# Patient Record
Sex: Male | Born: 1935 | Race: White | Hispanic: No | Marital: Married | Smoking: Never smoker
Health system: Southern US, Community
[De-identification: ages and names within clinical notes are randomized; demographics above are authoritative.]

## PROBLEM LIST (undated history)

## (undated) DIAGNOSIS — E119 Type 2 diabetes mellitus without complications: Secondary | ICD-10-CM

## (undated) DIAGNOSIS — F319 Bipolar disorder, unspecified: Secondary | ICD-10-CM

---

## 1999-04-03 ENCOUNTER — Inpatient Hospital Stay (HOSPITAL_COMMUNITY): Admission: AD | Admit: 1999-04-03 | Discharge: 1999-04-09 | Payer: Self-pay | Admitting: Psychiatry

## 2011-02-11 ENCOUNTER — Encounter (INDEPENDENT_AMBULATORY_CARE_PROVIDER_SITE_OTHER): Payer: Self-pay | Admitting: Ophthalmology

## 2011-03-08 ENCOUNTER — Encounter (INDEPENDENT_AMBULATORY_CARE_PROVIDER_SITE_OTHER): Payer: Self-pay | Admitting: Ophthalmology

## 2011-03-27 ENCOUNTER — Ambulatory Visit (INDEPENDENT_AMBULATORY_CARE_PROVIDER_SITE_OTHER): Payer: BC Managed Care – PPO | Admitting: Ophthalmology

## 2011-03-27 DIAGNOSIS — E11319 Type 2 diabetes mellitus with unspecified diabetic retinopathy without macular edema: Secondary | ICD-10-CM

## 2011-03-27 DIAGNOSIS — E1139 Type 2 diabetes mellitus with other diabetic ophthalmic complication: Secondary | ICD-10-CM

## 2011-03-27 DIAGNOSIS — E1165 Type 2 diabetes mellitus with hyperglycemia: Secondary | ICD-10-CM

## 2011-03-27 DIAGNOSIS — H43819 Vitreous degeneration, unspecified eye: Secondary | ICD-10-CM

## 2011-12-27 ENCOUNTER — Ambulatory Visit (INDEPENDENT_AMBULATORY_CARE_PROVIDER_SITE_OTHER): Payer: Medicare Other | Admitting: Ophthalmology

## 2011-12-27 DIAGNOSIS — H43819 Vitreous degeneration, unspecified eye: Secondary | ICD-10-CM

## 2011-12-27 DIAGNOSIS — H35039 Hypertensive retinopathy, unspecified eye: Secondary | ICD-10-CM

## 2011-12-27 DIAGNOSIS — E11319 Type 2 diabetes mellitus with unspecified diabetic retinopathy without macular edema: Secondary | ICD-10-CM

## 2011-12-27 DIAGNOSIS — E1165 Type 2 diabetes mellitus with hyperglycemia: Secondary | ICD-10-CM

## 2011-12-27 DIAGNOSIS — I10 Essential (primary) hypertension: Secondary | ICD-10-CM

## 2012-06-26 ENCOUNTER — Ambulatory Visit (INDEPENDENT_AMBULATORY_CARE_PROVIDER_SITE_OTHER): Payer: Medicare Other | Admitting: Ophthalmology

## 2014-02-17 ENCOUNTER — Encounter (HOSPITAL_COMMUNITY): Payer: Self-pay | Admitting: Emergency Medicine

## 2014-02-17 ENCOUNTER — Emergency Department (HOSPITAL_COMMUNITY)
Admission: EM | Admit: 2014-02-17 | Discharge: 2014-02-17 | Disposition: A | Discharge status: Home / Self Care | Payer: Medicare HMO | Attending: Emergency Medicine | Admitting: Emergency Medicine

## 2014-02-17 ENCOUNTER — Emergency Department (HOSPITAL_COMMUNITY): Payer: Medicare HMO

## 2014-02-17 DIAGNOSIS — S199XXA Unspecified injury of neck, initial encounter: Secondary | ICD-10-CM | POA: Diagnosis present

## 2014-02-17 DIAGNOSIS — Y9389 Activity, other specified: Secondary | ICD-10-CM | POA: Diagnosis not present

## 2014-02-17 DIAGNOSIS — R197 Diarrhea, unspecified: Secondary | ICD-10-CM | POA: Diagnosis not present

## 2014-02-17 DIAGNOSIS — Z8659 Personal history of other mental and behavioral disorders: Secondary | ICD-10-CM | POA: Diagnosis not present

## 2014-02-17 DIAGNOSIS — S12100A Unspecified displaced fracture of second cervical vertebra, initial encounter for closed fracture: Secondary | ICD-10-CM | POA: Diagnosis not present

## 2014-02-17 DIAGNOSIS — S12000A Unspecified displaced fracture of first cervical vertebra, initial encounter for closed fracture: Secondary | ICD-10-CM | POA: Insufficient documentation

## 2014-02-17 DIAGNOSIS — W1812XA Fall from or off toilet with subsequent striking against object, initial encounter: Secondary | ICD-10-CM | POA: Diagnosis not present

## 2014-02-17 DIAGNOSIS — W19XXXA Unspecified fall, initial encounter: Secondary | ICD-10-CM

## 2014-02-17 DIAGNOSIS — E119 Type 2 diabetes mellitus without complications: Secondary | ICD-10-CM | POA: Insufficient documentation

## 2014-02-17 DIAGNOSIS — Y92002 Bathroom of unspecified non-institutional (private) residence single-family (private) house as the place of occurrence of the external cause: Secondary | ICD-10-CM | POA: Insufficient documentation

## 2014-02-17 DIAGNOSIS — Y998 Other external cause status: Secondary | ICD-10-CM | POA: Insufficient documentation

## 2014-02-17 HISTORY — DX: Type 2 diabetes mellitus without complications: E11.9

## 2014-02-17 HISTORY — DX: Bipolar disorder, unspecified: F31.9

## 2014-02-17 LAB — CBC WITH DIFFERENTIAL/PLATELET
BASOS PCT: 0 % (ref 0–1)
Basophils Absolute: 0 10*3/uL (ref 0.0–0.1)
EOS PCT: 3 % (ref 0–5)
Eosinophils Absolute: 0.2 10*3/uL (ref 0.0–0.7)
HEMATOCRIT: 32.9 % — AB (ref 39.0–52.0)
HEMOGLOBIN: 11.2 g/dL — AB (ref 13.0–17.0)
Lymphocytes Relative: 15 % (ref 12–46)
Lymphs Abs: 0.9 10*3/uL (ref 0.7–4.0)
MCH: 31.5 pg (ref 26.0–34.0)
MCHC: 34 g/dL (ref 30.0–36.0)
MCV: 92.4 fL (ref 78.0–100.0)
MONO ABS: 0.5 10*3/uL (ref 0.1–1.0)
MONOS PCT: 8 % (ref 3–12)
Neutro Abs: 4.3 10*3/uL (ref 1.7–7.7)
Neutrophils Relative %: 74 % (ref 43–77)
Platelets: 136 10*3/uL — ABNORMAL LOW (ref 150–400)
RBC: 3.56 MIL/uL — ABNORMAL LOW (ref 4.22–5.81)
RDW: 12.1 % (ref 11.5–15.5)
WBC: 5.8 10*3/uL (ref 4.0–10.5)

## 2014-02-17 LAB — COMPREHENSIVE METABOLIC PANEL
ALT: 10 U/L (ref 0–53)
ANION GAP: 11 (ref 5–15)
AST: 17 U/L (ref 0–37)
Albumin: 3.6 g/dL (ref 3.5–5.2)
Alkaline Phosphatase: 65 U/L (ref 39–117)
BILIRUBIN TOTAL: 0.7 mg/dL (ref 0.3–1.2)
BUN: 23 mg/dL (ref 6–23)
CO2: 24 meq/L (ref 19–32)
CREATININE: 1.1 mg/dL (ref 0.50–1.35)
Calcium: 9.1 mg/dL (ref 8.4–10.5)
Chloride: 103 mEq/L (ref 96–112)
GFR calc Af Amer: 70 mL/min — ABNORMAL LOW (ref >90)
GFR, EST NON AFRICAN AMERICAN: 61 mL/min — AB (ref >90)
Glucose, Bld: 121 mg/dL — ABNORMAL HIGH (ref 70–99)
Potassium: 4.6 mEq/L (ref 3.7–5.3)
Sodium: 138 mEq/L (ref 137–147)
Total Protein: 6.9 g/dL (ref 6.0–8.3)

## 2014-02-17 MED ORDER — SODIUM CHLORIDE 0.9 % IV BOLUS (SEPSIS)
500.0000 mL | Freq: Once | INTRAVENOUS | Status: AC
  Administered 2014-02-17: 500 mL via INTRAVENOUS

## 2014-02-17 MED ORDER — HYDROCODONE-ACETAMINOPHEN 5-325 MG PO TABS: 1.0000 | ORAL_TABLET | ORAL | Status: AC | PRN

## 2014-02-17 MED ORDER — MORPHINE SULFATE 2 MG/ML IJ SOLN
2.0000 mg | Freq: Once | INTRAMUSCULAR | Status: AC
Start: 2014-02-17 — End: 2014-02-17
  Administered 2014-02-17: 2 mg via INTRAVENOUS
  Filled 2014-02-17: qty 1

## 2014-02-17 NOTE — ED Notes (Signed)
Patient transported to CT 

## 2014-02-17 NOTE — Discharge Instructions (Signed)
Hydrocodone as prescribed as needed for pain.  Wear the cervical collar as applied until follow-up with neurosurgery. The contact information for Dr. Danielle DessElsner has been provided in this discharge summary. Call tomorrow to arrange an appointment for next week. He is aware of your situation.   Cervical Spine Fracture, Stable A cervical spine fracture is a break or crack in one of the bones of the neck. A fracture is stable if the chances of it causing you problems while it is healing are very small. CAUSES   Vehicle accidents.  Injuries from sports such as diving, football, biking, wrestling, or skiing.  Occasionally, severe osteoporosis or other bone diseases, such as cancers that spread to bone or metabolic abnormalities. SYMPTOMS   Severe neck pain after an accident or fall.  Pain down your shoulders or arms.  Bruising or swelling on the back of your neck.  Numbness, tingling, muscle spasm, or weakness. DIAGNOSIS  Cervical spine fracture is diagnosed with the help of X-ray exams of your neck. Often a CT scan or MRI is used to confirm the diagnosis and help determine how your injury should be treated. Generally, an examination of your neck, arms, and legs, and the history of your injury prompts the health care provider to order these tests.  TREATMENT  A stable fracture needs to be treated with a brace or cervical collar. A cervical collar is a two-piece collar designed to keep your neck from moving during the healing process. HOME CARE INSTRUCTIONS  Limit physical activity to prevent worsening of the fracture.  Apply ice to areas of pain 3-4 times a day for 2 days.  Put ice in a bag.  Place a towel between your skin and the bag.  Leave the ice on for 15-20 minutes, 3-4 times a day.  You may have been given a cervical collar to wear.  Do not remove the collar unless instructed by your health care provider.  If you have long hair, keep it outside of the collar.  Ask your  health care provider before making any adjustments to your collar. Minor adjustments may be required over time to improve comfort and reduce pressure on your chin or on the back of your head.  Keep your collar clean by wiping it with mild soap and water and drying it completely. The pads can be hand washed with soap and water and air dried completely.  If you are allowed to remove the collar for cleaning or bathing, follow your health care provider's instructions on how to do so safely.  If you are allowed to remove the collar for cleaning and bathing, wash and dry the skin of your neck. Check your skin for irritation or sores. If you see any, tell your health care provider.  Only take over-the-counter or prescription medicines for pain, discomfort, or fever as directed by your health care provider.   Keep all follow-up appointments as directed by your health care provider. Not keeping an appointment could result in a chronic or permanent injury, pain, and disability. Additionally, X-rays or an MRI may be repeated 1-3 weeks after your initial appointment. This is to:  Make sure any other breaks or cracks were not missed.   Help identify stretched or torn ligaments.   Get your test results if you did not get them when you were first evaluated. The results will determine whether you need other tests or treatment. It is your responsibility to get the results. SEEK MEDICAL CARE IF: You have irritation  or sores on your skin from the cervical collar. SEEK IMMEDIATE MEDICAL CARE IF:   You have increasing pain in your neck.   You develop difficulties swallowing or breathing.  You develop swelling in your neck.   You have numbness, weakness, burning pain, or movement problems in the arms or legs.   You are unable to control your bowel or bladder (incontinence).   You have problems with coordination or difficulty walking. MAKE SURE YOU:   Understand these instructions.  Will watch  your condition.  Will get help right away if you are not doing well or get worse. Document Released: 01/20/2004 Document Revised: 03/09/2013 Document Reviewed: 09/28/2012 [facility] Patient Information 2015 [place], [place]. This information is not intended to replace advice given to you by your health care provider. Make sure you discuss any questions you have with your health care provider.  Cervical Collar A cervical collar is used to hold the head and neck still. This may be a soft, thick collar or a more rigid hard collar. This can keep your sore neck muscles from hurting. The collar also protects you in case a more serious injury of the neck is present and can not be seen yet on an x-ray. FOLLOW THESE INSTRUCTIONS FOR COLLAR USE:  Attach the Velcro tab in back.  Make it tight enough to support part of the weight of your chin.  Do not make it so tight that it hurts or makes it hard to breathe.  Wear it until you are comfortable without it or as instructed. HOME CARE INSTRUCTIONS   Ice packs to the neck or areas of pain approximately 03-04 times a day for 15-20 minutes while awake. Do this for 2 days. Ask your physician if you may remove the cervical collar for bathing or ice.  Do not remove any collar unless instructed by a caregiver. Ask if the collar may be removed for showering or eating.  Only take over-the-counter or prescription medicines for pain, discomfort, or fever as directed by your caregiver.  Do not drive a car until given permission by your caregiver.  Follow all instructions for follow-up with your caregiver. This includes any referrals, physical therapy, and rehabilitation. Any delay in obtaining necessary care could result in a delay or failure of the injury to heal properly. SEEK IMMEDIATE MEDICAL CARE IF:   You develop increasing pain.  You develop problems using your arms or legs or have tingling or other funny feelings in them.  You develop loss of strength  in either of your arms or legs or have difficulty walking.  You develop a fever or shaking chills.  You lose control of your bowels or bladder. MAKE SURE YOU:   Understand these instructions.  Will watch your condition.  Will get help right away if you are not doing well or get worse. Document Released: 11/25/2003 Document Revised: 05/27/2011 Document Reviewed: 10/26/2007 [facility]Exit[facility] Patient Information 2015 [place], [place]. This information is not intended to replace advice given to you by your health care provider. Make sure you discuss any questions you have with your health care provider.

## 2014-02-17 NOTE — ED Provider Notes (Signed)
CSN: [id number]     Arrival date & time 02/17/14  1828 History   First MD Initiated Contact with Patient 02/17/14 2004     Chief Complaint  Patient presents with  . Fall  . Neck Pain  . Diarrhea     (Consider location/radiation/quality/duration/timing/severity/associated sxs/prior Treatment) HPI Comments: Patient is an 80 year old male with history of diabetes and bipolar. He presents today for evaluation of neck pain and possible fractures of C1 and C2.  Patient has been having diarrhea for several days. 3 days ago while he was sitting on the toilet, he had what sounds like a syncopal episode. He fell and struck his head on the wall. Since that time he has had discomfort in his neck. He finally went to urgent care earlier today and had x-rays performed. He was discharged, and then was called back by the facility to inform him he had fractures of C1 and C2. They advised him to come to the ER to be evaluated.  Patient denies any abdominal pain. He states his diarrhea has improved and he has not had anymore since 3 AM. All of his diarrhea has been nonbloody, and non-melanotic.  Patient is a 80 y.o. male presenting with fall. The history is provided by the patient.  Fall This is a new problem. Episode onset: 3 days ago. The problem occurs constantly. The problem has not changed since onset.Pertinent negatives include no shortness of breath. Exacerbated by: Turning head. Nothing relieves the symptoms. He has tried nothing for the symptoms. The treatment provided no relief.    Past Medical History  Diagnosis Date  . Diabetes mellitus without complication   . Bipolar depression    History reviewed. No pertinent past surgical history. History reviewed. No pertinent family history. History  Substance Use Topics  . Smoking status: Never Smoker   . Smokeless tobacco: Not on file  . Alcohol Use: No    Review of Systems  Respiratory: Negative for shortness of breath.   All other systems  reviewed and are negative.     Allergies  Review of patient's allergies indicates no known allergies.  Home Medications   Prior to Admission medications   Not on File   BP 172/60 mmHg  Pulse 65  Temp(Src) 98.2 F (36.8 C) (Oral)  Resp 17  SpO2 98% Physical Exam  Constitutional: He is oriented to person, place, and time. He appears well-developed and well-nourished. No distress.  HENT:  Head: Normocephalic and atraumatic.  Eyes: EOM are normal. Pupils are equal, round, and reactive to light.  Neck:  Patient is immobilized in a cervical collar, however there is discomfort in the upper cervical region. There is no step-off.  Cardiovascular: Normal rate, regular rhythm and normal heart sounds.   No murmur heard. Pulmonary/Chest: Effort normal and breath sounds normal. No respiratory distress.  Abdominal: Soft. Bowel sounds are normal. He exhibits no distension. There is no tenderness.  Musculoskeletal: Normal range of motion. He exhibits no edema.  Neurological: He is alert and oriented to person, place, and time. No cranial nerve deficit. He exhibits normal muscle tone. Coordination normal.  Skin: Skin is warm and dry. He is not diaphoretic.  Nursing note and vitals reviewed.   ED Course  Procedures (including critical care time) Labs Review Labs Reviewed  CBC WITH DIFFERENTIAL - Abnormal; Notable for the following:    RBC 3.56 (*)    Hemoglobin 11.2 (*)    HCT 32.9 (*)    Platelets 136 (*)  All other components within normal limits  COMPREHENSIVE METABOLIC PANEL - Abnormal; Notable for the following:    Glucose, Bld 121 (*)    GFR calc non Af Amer 61 (*)    GFR calc Af Amer 70 (*)    All other components within normal limits    Imaging Review No results found.   EKG Interpretation   Date/Time:  Thursday February 17 2014 19:54:14 EST Ventricular Rate:  66 PR Interval:  138 QRS Duration: 91 QT Interval:  383 QTC Calculation: 401 R Axis:   85 Text  Interpretation:  Sinus rhythm Ventricular premature complex  Borderline right axis deviation Confirmed by DELOS  MD, Jen Eppinger ([id number]) on  02/17/2014 8:34:02 PM      MDM   Final diagnoses:  Fall    Patient is an 80 year old male with history of hypertension and dementia. He presents for evaluation of neck pain. He has been experiencing diarrhea for the past week. 3 days ago while sitting on the toilet he had a syncopal episode and hit his head on the wall. He has been having the pain for the past 3 days and saw his primary Dr. this afternoon. He had x-rays performed which were suspicious for a fracture and was sent here.  CT scan in the ER reveals nondisplaced fractures of the odontoid and C1 vertebrae. I've discussed these findings with Dr. Danielle DessElsner from neurosurgery who does not feel as though these fractures are emergently surgical. He is recommending a cervical collar and follow-up in the office in one week. Patient will be prescribed pain medication and advised to wear this at all times unless showering until he sees Dr. Danielle DessElsner.    Geoffery Lyonsouglas Walida Cajas, MD 02/17/14 2351

## 2014-02-17 NOTE — ED Notes (Signed)
Pt with fall on Monday; pt sent here today for C1 and C2 fracture; pt with diarrhea x several days; pt with c collar in place at present

## 2014-07-15 ENCOUNTER — Encounter (INDEPENDENT_AMBULATORY_CARE_PROVIDER_SITE_OTHER): Payer: Medicare PPO | Admitting: Ophthalmology

## 2014-07-15 DIAGNOSIS — E11319 Type 2 diabetes mellitus with unspecified diabetic retinopathy without macular edema: Secondary | ICD-10-CM | POA: Diagnosis not present

## 2014-07-15 DIAGNOSIS — E11339 Type 2 diabetes mellitus with moderate nonproliferative diabetic retinopathy without macular edema: Secondary | ICD-10-CM | POA: Diagnosis not present

## 2014-07-15 DIAGNOSIS — H35033 Hypertensive retinopathy, bilateral: Secondary | ICD-10-CM | POA: Diagnosis not present

## 2014-07-15 DIAGNOSIS — H43813 Vitreous degeneration, bilateral: Secondary | ICD-10-CM

## 2014-07-15 DIAGNOSIS — I10 Essential (primary) hypertension: Secondary | ICD-10-CM

## 2015-07-17 ENCOUNTER — Ambulatory Visit (INDEPENDENT_AMBULATORY_CARE_PROVIDER_SITE_OTHER): Payer: Medicare Other | Admitting: Ophthalmology

## 2015-07-17 DIAGNOSIS — I10 Essential (primary) hypertension: Secondary | ICD-10-CM

## 2015-07-17 DIAGNOSIS — E113393 Type 2 diabetes mellitus with moderate nonproliferative diabetic retinopathy without macular edema, bilateral: Secondary | ICD-10-CM

## 2015-07-17 DIAGNOSIS — H43813 Vitreous degeneration, bilateral: Secondary | ICD-10-CM | POA: Diagnosis not present

## 2015-07-17 DIAGNOSIS — E11319 Type 2 diabetes mellitus with unspecified diabetic retinopathy without macular edema: Secondary | ICD-10-CM | POA: Diagnosis not present

## 2015-07-17 DIAGNOSIS — H35033 Hypertensive retinopathy, bilateral: Secondary | ICD-10-CM | POA: Diagnosis not present

## 2016-02-27 IMAGING — CT CT CERVICAL SPINE W/O CM
3 of 4 series · 13 of 33 positions shown, 16 images · non-contrast
Comparison: None.

CLINICAL DATA: Pt with fall on [REDACTED]; pt sent here today for C1
and C2 fracture; c/o neck pain

EXAM:
CT CERVICAL SPINE WITHOUT CONTRAST
TECHNIQUE: Multidetector CT imaging of the cervical spine was performed without
intravenous contrast. Multiplanar CT image reconstructions were also
generated.

[Series 5: coronals · coronal · 0.31mm/px · 3 of 49 slices shown]
[im 10/49  bone]
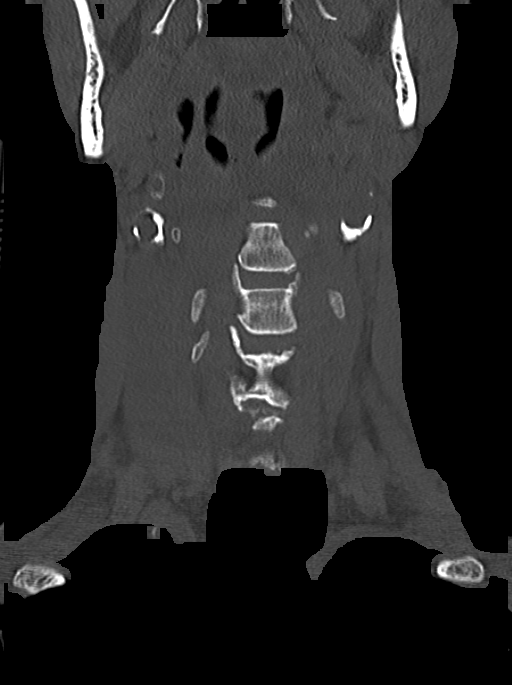
[im 20/49  bone]
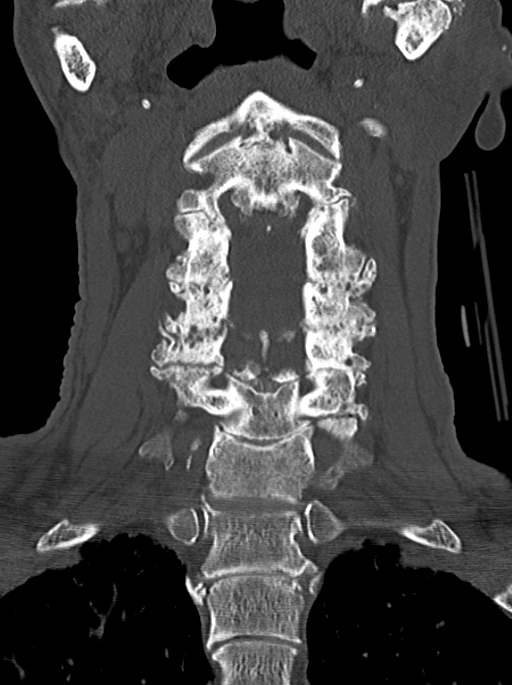
[im 29/49  bone]
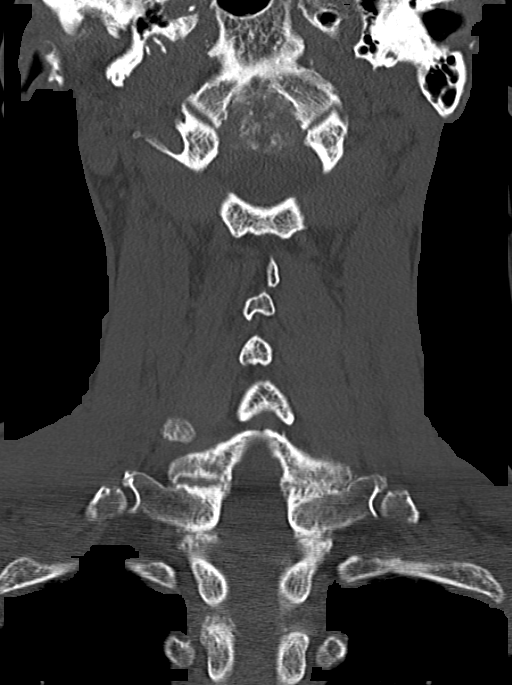

[Series 6: sagittals · sagittal · 0.31mm/px · 5 of 49 slices shown, 6 images]
[im 17/49  bone]
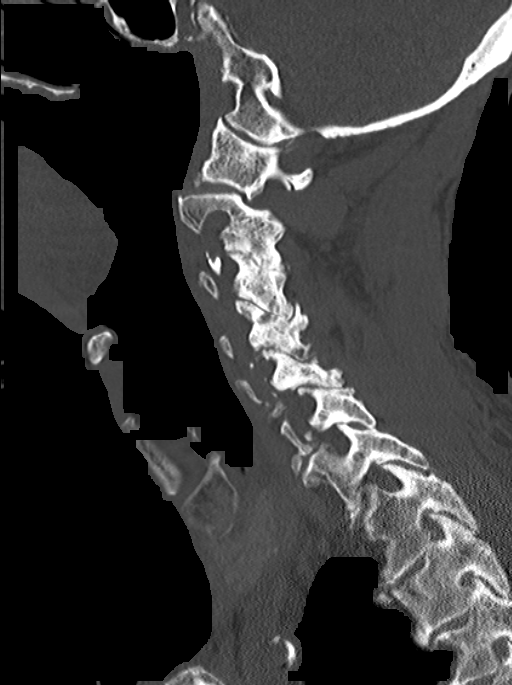
[im 21/49  bone]
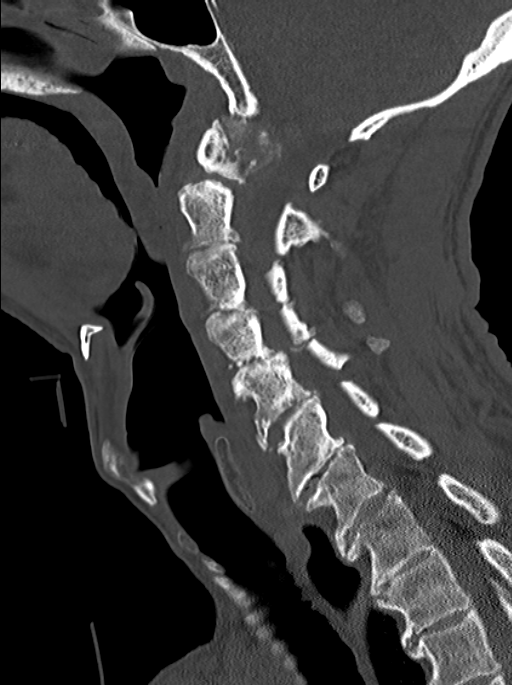
[im 25/49  soft-tissue]
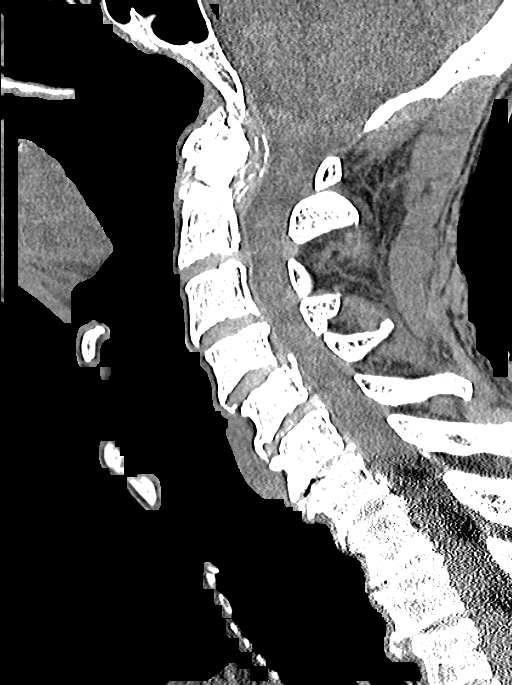
[im 25/49  bone]
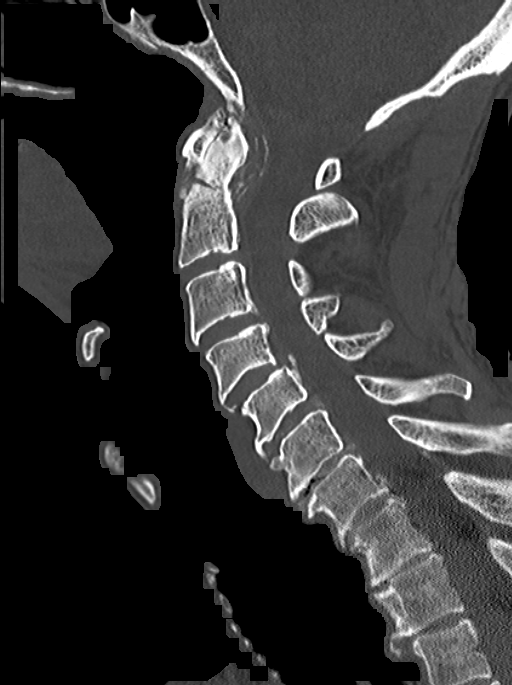
[im 29/49  bone]
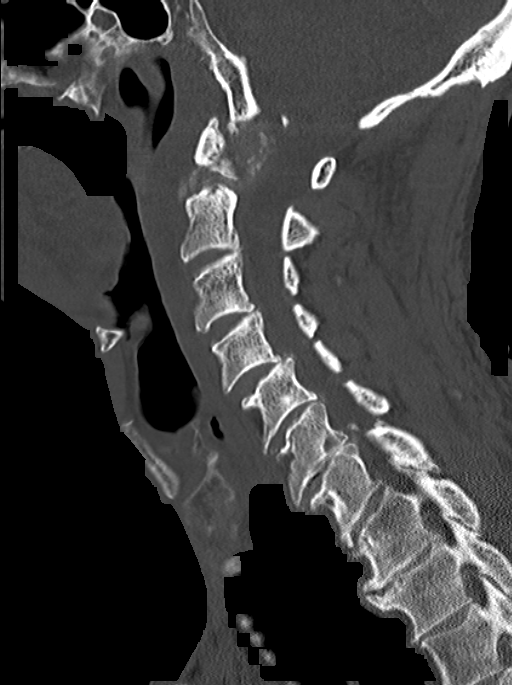
[im 33/49  bone]
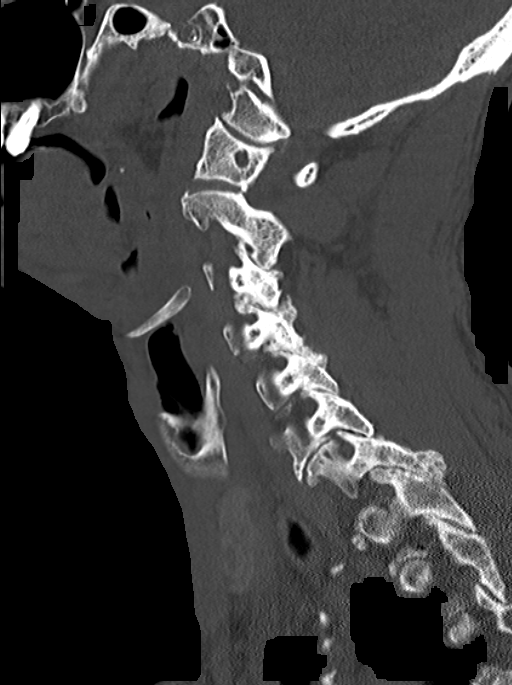

[Series 9: thins · axial · 0.21mm/px · z∈[+1073,+1208]mm · 5 of 135 slices shown, 7 images]
[im 20/135  soft-tissue]
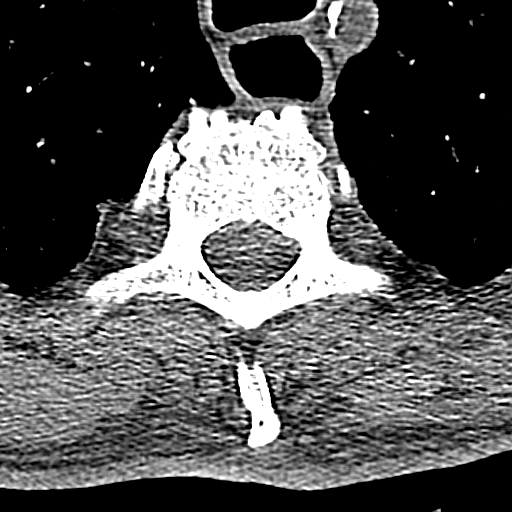
[im 20/135  bone]
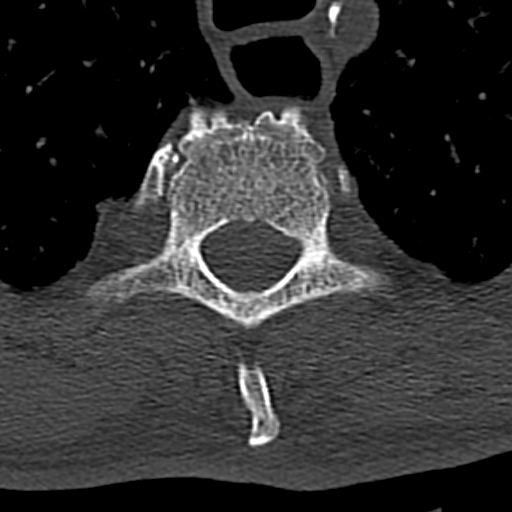
[im 39/135  bone]
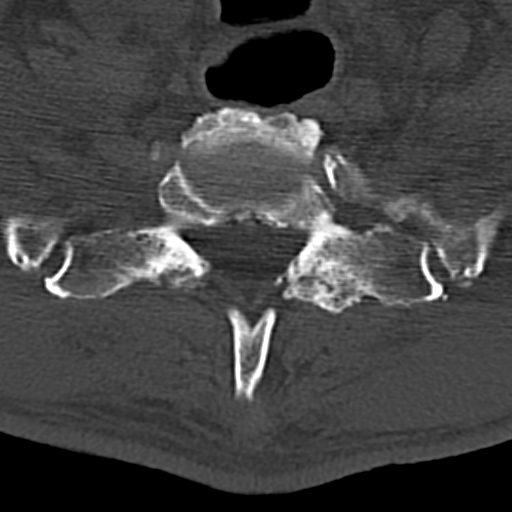
[im 77/135  bone]
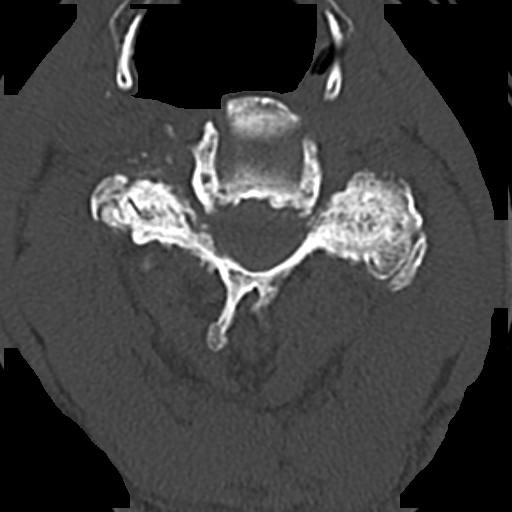
[im 96/135  bone]
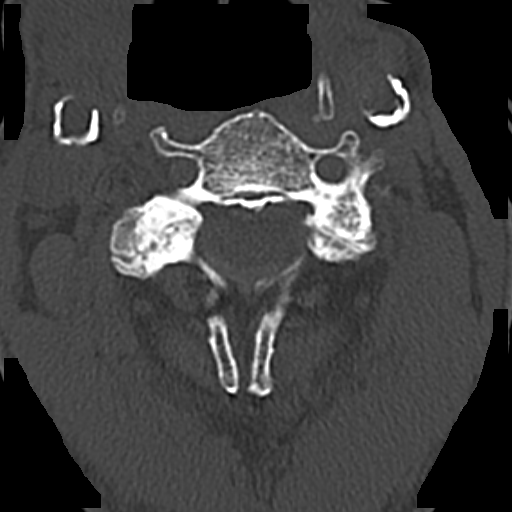
[im 115/135  soft-tissue]
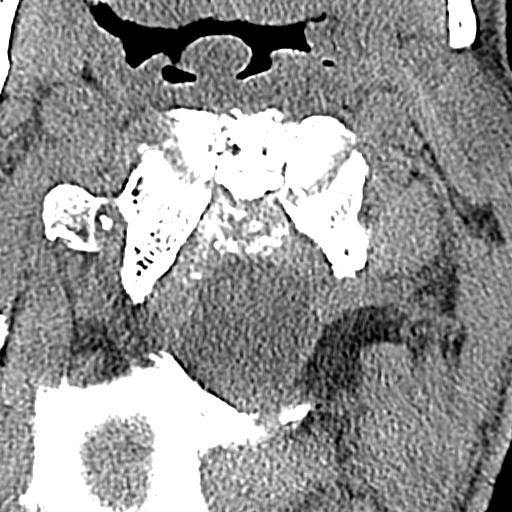
[im 115/135  bone]
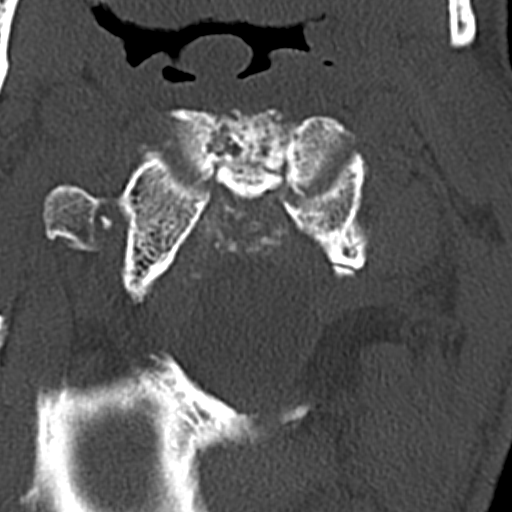

[13 of 33 positions shown; findings below may reference images not displayed]

FINDINGS: The alignment is anatomic. The vertebral body heights are
maintained. There is a type 2 odontoid fracture without
displacement. There are nondisplaced fractures of the lamina of C1
bilaterally. There is no static listhesis. The prevertebral soft
tissues are normal. The intraspinal soft tissues are not fully
imaged on this examination due to poor soft tissue contrast, but
there is no gross soft tissue abnormality.

There is degenerative disc disease throughout the cervical spine.
There is bilateral uncovertebral degenerative change and severe
facet arthropathy at C2-3 with bilateral foraminal encroachment.
There is bilateral uncovertebral degenerative change and severe
bilateral facet arthropathy at C3-4 with bilateral severe foraminal
narrowing. There is bilateral uncovertebral degenerative change and
severe facet arthropathy at C4-5 and C5-6 with severe foraminal
narrowing. There is bilateral uncovertebral degenerative change,
left greater than right with bilateral facet arthropathy resulting
in severe left foraminal narrowing. Left uncovertebral degenerative
change and severe left facet arthropathy resulting in severe left
foraminal narrowing.

There is osteoarthritis of bilateral temporomandibular joints.

There is a 6 mm ground-glass pulmonary nodule in the right upper
lobe on image 107/series 3.There is bilateral carotid artery
atherosclerosis.
IMPRESSION: 1. Type 2 odontoid fracture without displacement.
2. Nondisplaced bilateral C1 lamina fractures.
3. Cervical spondylosis as described above.

## 2016-07-16 ENCOUNTER — Ambulatory Visit (INDEPENDENT_AMBULATORY_CARE_PROVIDER_SITE_OTHER): Payer: Medicare Other | Admitting: Ophthalmology

## 2016-07-16 DIAGNOSIS — H35033 Hypertensive retinopathy, bilateral: Secondary | ICD-10-CM

## 2016-07-16 DIAGNOSIS — E113393 Type 2 diabetes mellitus with moderate nonproliferative diabetic retinopathy without macular edema, bilateral: Secondary | ICD-10-CM | POA: Diagnosis not present

## 2016-07-16 DIAGNOSIS — H43813 Vitreous degeneration, bilateral: Secondary | ICD-10-CM | POA: Diagnosis not present

## 2016-07-16 DIAGNOSIS — E11319 Type 2 diabetes mellitus with unspecified diabetic retinopathy without macular edema: Secondary | ICD-10-CM | POA: Diagnosis not present

## 2016-07-16 DIAGNOSIS — I10 Essential (primary) hypertension: Secondary | ICD-10-CM

## 2017-07-17 ENCOUNTER — Encounter (INDEPENDENT_AMBULATORY_CARE_PROVIDER_SITE_OTHER): Payer: Medicare Other | Admitting: Ophthalmology

## 2017-07-17 DIAGNOSIS — H43813 Vitreous degeneration, bilateral: Secondary | ICD-10-CM

## 2017-07-17 DIAGNOSIS — E113393 Type 2 diabetes mellitus with moderate nonproliferative diabetic retinopathy without macular edema, bilateral: Secondary | ICD-10-CM

## 2017-07-17 DIAGNOSIS — H35033 Hypertensive retinopathy, bilateral: Secondary | ICD-10-CM

## 2017-07-17 DIAGNOSIS — E11319 Type 2 diabetes mellitus with unspecified diabetic retinopathy without macular edema: Secondary | ICD-10-CM | POA: Diagnosis not present

## 2017-07-17 DIAGNOSIS — I10 Essential (primary) hypertension: Secondary | ICD-10-CM

## 2017-07-18 ENCOUNTER — Ambulatory Visit (INDEPENDENT_AMBULATORY_CARE_PROVIDER_SITE_OTHER): Payer: Medicare Other | Admitting: Ophthalmology

## 2018-06-30 ENCOUNTER — Encounter (INDEPENDENT_AMBULATORY_CARE_PROVIDER_SITE_OTHER): Payer: Medicare Other | Admitting: Ophthalmology

## 2018-07-08 ENCOUNTER — Encounter (INDEPENDENT_AMBULATORY_CARE_PROVIDER_SITE_OTHER): Payer: Medicare Other | Admitting: Ophthalmology

## 2018-08-31 ENCOUNTER — Encounter (INDEPENDENT_AMBULATORY_CARE_PROVIDER_SITE_OTHER): Payer: Medicare Other | Admitting: Ophthalmology

## 2020-03-18 DEATH — deceased
